# Patient Record
Sex: Male | Born: 1960 | Race: White | Hispanic: No | Marital: Married | Smoking: Former smoker
Health system: Southern US, Community
[De-identification: ages and names within clinical notes are randomized; demographics above are authoritative.]

## PROBLEM LIST (undated history)

## (undated) DIAGNOSIS — E785 Hyperlipidemia, unspecified: Secondary | ICD-10-CM

## (undated) DIAGNOSIS — R030 Elevated blood-pressure reading, without diagnosis of hypertension: Secondary | ICD-10-CM

## (undated) HISTORY — DX: Hyperlipidemia, unspecified: E78.5

## (undated) HISTORY — DX: Elevated blood-pressure reading, without diagnosis of hypertension: R03.0

## (undated) HISTORY — PX: HERNIA REPAIR: SHX51

---
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
## 1994-09-02 HISTORY — PX: NASAL SEPTUM SURGERY: SHX37

## 1995-09-03 HISTORY — PX: SPINAL FUSION: SHX223

## 1998-09-02 HISTORY — PX: OTHER SURGICAL HISTORY: SHX169

## 2010-08-14 ENCOUNTER — Encounter (INDEPENDENT_AMBULATORY_CARE_PROVIDER_SITE_OTHER): Payer: Self-pay | Admitting: *Deleted

## 2010-08-28 ENCOUNTER — Ambulatory Visit: Payer: Self-pay | Admitting: Internal Medicine

## 2010-08-28 DIAGNOSIS — K219 Gastro-esophageal reflux disease without esophagitis: Secondary | ICD-10-CM

## 2010-08-28 DIAGNOSIS — K625 Hemorrhage of anus and rectum: Secondary | ICD-10-CM

## 2010-09-05 ENCOUNTER — Telehealth: Payer: Self-pay | Admitting: Internal Medicine

## 2010-10-04 NOTE — Assessment & Plan Note (Signed)
Summary: ACID REFLUX ISSUES (new patient)   History of Present Illness Visit Type: Initial Visit Primary GI MD: Yancey Flemings MD Primary Provider: n/a Requesting Provider: n/a Chief Complaint: persistant GERD x 1 year History of Present Illness:   50 year old white male with a history of hyperlipidemia and cervical spine disease. He is self-referred regarding problems with chronic GERD. 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 He reports to me a one-year history of problems with pyrosis, regurgitation, and cough. He was seen by an ENT in [place] about 3-4 months ago and diagnosed with GERD. He was placed on omeprazole 40 mg daily for 2 months. This resolved symptoms. Off medication symptoms have returned. Prilosec OTC in the equivalent dosage does not seem to work quite as well. His weight has fluctuated around its current value. He denies nausea, vomiting, or dysphagia. He will occasionally cough with meals.. Occasional dark stools.. No prior history of GI problems or GI evaluations. His review of systems is remarkable for minor intermittent rectal bleeding as manifested by bright red blood on the tissue over the past 6 months. No family history of colon cancer.   GI Review of Systems    Reports acid reflux, belching, bloating, and  heartburn.      Denies abdominal pain, chest pain, dysphagia with liquids, dysphagia with solids, loss of appetite, nausea, vomiting, vomiting blood, weight loss, and  weight gain.      Reports black tarry stools and  rectal bleeding.     Denies anal fissure, change in bowel habit, constipation, diarrhea, diverticulosis, fecal incontinence, heme positive stool, hemorrhoids, irritable bowel syndrome, jaundice, light color stool, liver problems, and  rectal pain. Preventive Screening-Counseling & Management  Alcohol-Tobacco     Smoking Status: quit      Drug Use:  no.       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  Alcohol Use - no Daily Caffeine Use Illicit Drug Use - no Smoking Status:  quit Drug Use:  no  Review of Systems       The patient complains of allergy/sinus, anxiety-new, cough, depression-new, and fatigue.  The patient denies anemia, arthritis/joint pain, back pain, blood in urine, breast changes/lumps, change in vision, confusion, coughing up blood, fainting, fever, headaches-new, hearing problems, heart murmur, heart rhythm changes, itching, menstrual pain, muscle pains/cramps, night sweats, nosebleeds, pregnancy symptoms, shortness of breath, skin rash, sleeping problems, sore throat, swelling of feet/legs, swollen lymph glands, thirst - excessive , urination - excessive , urination changes/pain, urine leakage, vision changes, and voice change.    Vital Signs:  Patient profile:   50 year old male Height:      75 inches Weight:      236.13 pounds BMI:     29.62 Pulse rate:   80 / minute Pulse rhythm:   regular BP sitting:   122 / 90  (left arm) Cuff size:   regular  Vitals Entered By: June McMurray CMA Duncan Dull) (August 28, 2010 9:16 AM)  Physical Exam  General:  Well developed, well nourished, no acute distress. Head:  Normocephalic and atraumatic. Eyes:  PERRLA, no icterus. Ears:  Normal auditory acuity. Mouth:  No deformity or lesions, dentition normal. Neck:  Supple; no masses or thyromegaly. Lungs:  Clear throughout to auscultation. Heart:  Regular rate and rhythm; no murmurs, rubs,  or  bruits. Abdomen:  Soft, nontender and nondistended. No masses, hepatosplenomegaly or hernias noted. Normal bowel sounds. Msk:  Symmetrical with no gross deformities. Normal posture. Pulses:  Normal pulses noted. Extremities:  No clubbing, cyanosis, edema or deformities noted. Neurologic:  Alert and  oriented x4 Skin:  Intact without significant lesions or rashes. Psych:  Alert and cooperative. Normal mood and affect.   Impression & Recommendations:  Problem # 1:  GERD (ICD-530.81) chronic GERD requiring higher dose PPI for control of symptoms.  Plan: #1. Discussion on reflux disease pathophysiology and treatment as well as complications #2. Literature on GERD provided #3. Reflux precautions. Literature on reflux precautions also provided #4. Upper endoscopy to screen for Barrett's esophagus #5. Prescribe omeprazole 40 mg daily #6. Routine followup in one year. Advised  Problem # 2:  SCREENING COLORECTAL-CANCER (ICD-V76.51) baseline risk. Appropriate candidate without contraindication. The nature of colonoscopy as well as the risks, benefits, and alternatives were reviewed. He understood and agreed to proceed. Movi prep prescribed. The patient instructed on its use.  Problem # 3:  RECTAL BLEEDING (ICD-569.3) minor intermittent. Likely due to benign anorectal pathology. Can be assessed at time of screening colonoscopy  Patient Instructions: 1)  Pick up your prescription from your pharmacy.  2)  Call back to schedule a Upper Endoscopy and Colonoscopy after you look at your schedule.  3)  Avoid foods high in acid content ( tomatoes, citrus juices, spicy foods) . Avoid eating within 3 to 4 hours of lying down or before exercising. Do not over eat; try smaller more frequent meals. Elevate head of bed four inches when sleeping.  4)  The medication list was reviewed and reconciled.  All changed / newly prescribed medications were explained.  A complete medication list was provided to the  patient / caregiver. 5)  Secure a primary care provider Prescriptions: OMEPRAZOLE 40 MG CPDR (OMEPRAZOLE) one tablet by mouth every morning  #30 x 11   Entered by:   Christie Nottingham CMA (AAMA)   Authorized by:   Hilarie Fredrickson MD   Signed by:   Christie Nottingham CMA (AAMA) on 08/28/2010   Method used:   Electronically to        [facility] [place] [address] [phone]* (retail)       [address] [place], [place]  [address]       Ph: [phone]       Fax: [phone]   RxID:   [id number]

## 2010-10-04 NOTE — Medication Information (Signed)
Summary: Prior Authorization Omeprazole / [facility]  Prior Authorization Omeprazole / [facility]   Imported By: Lennie Odor 09/05/2010 15:06:22  _____________________________________________________________________  External Attachment:    Type:   Image     Comment:   External Document

## 2010-10-04 NOTE — Progress Notes (Signed)
Summary: Medication  Phone Note Call from Patient Call back at Home Phone [phone]   Caller: Patient Call For: Dr.Mima Cranmore Reason for Call: Refill Medication Summary of Call: Pt [facility] [place] because [facility] no longer accepts his insurance Initial call taken by: [place] Johnson,  September 05, 2010 4:19 PM    Prescriptions: OMEPRAZOLE 40 MG CPDR (OMEPRAZOLE) one tablet by mouth every morning  #30 x 11   Entered by:   Milford Cage NCMA   Authorized by:   Hilarie Fredrickson MD   Signed by:   Milford Cage NCMA on 09/05/2010   Method used:   [facility]  [place] [address] (retail[facility]       [address]       [place], [place]  [address]       Ph: [phone] or [phone]       Fax: [phone]   RxID:   [id number]

## 2010-10-04 NOTE — Letter (Signed)
Summary: New Patient letter  [facility]  [address] [place], [place] [address]   Phone: [phone]  Fax: [phone]       08/14/2010 MRN: [id number]    [place] [address] [place], [place]  [address]  Dear Mr. Klas,  Welcome to the Gastroenterology Division at [facility].    You are scheduled to see Dr. Marina Goodell on Tuesday, 08/28/10 at 9:15 a.m. on the 3rd floor at [facility], [address]  We ask that you try to arrive at our office 15 minutes prior to your appointment time to allow for check-in.  We would like you to complete the enclosed self-administered evaluation form prior to your visit and bring it with you on the day of your appointment.  We will review it with you.  Also, please bring a complete list of all your medications or, if you prefer, bring the medication bottles and we will list them.  Please bring your insurance card so that we may make a copy of it.  If your insurance requires a referral to see a specialist, please bring your referral form from your primary care physician.  Co-payments are due at the time of your visit and may be paid by cash, check or credit card.     Your office visit will consist of a consult with your physician (includes a physical exam), any laboratory testing he/she may order, scheduling of any necessary diagnostic testing (e.g. x-ray, ultrasound, CT-scan), and scheduling of a procedure (e.g. Endoscopy, Colonoscopy) if required.  Please allow enough time on your schedule to allow for any/all of these possibilities.    If you cannot keep your appointment, please call [phone] to cancel or reschedule prior to your appointment date.  This allows [place] the opportunity to schedule an appointment for another patient in need of care.  If you do not cancel or reschedule by 5 p.m. the business day prior to your appointment date, you will be charged a $50.00 late cancellation/no-show fee.     Thank you for choosing [facility] for your medical needs.  We appreciate the opportunity to care for you.  Please visit [place] at our website  to learn more about our practice.                     Sincerely,                                                             The Gastroenterology Division

## 2010-10-04 NOTE — Medication Information (Signed)
Summary: Approved/BC[employer]  Approved/[employer]BS   Imported By: Lester Stockton 09/06/2010 10:07:35  _____________________________________________________________________  External Attachment:    Type:   Image     Comment:   External Document

## 2011-10-07 ENCOUNTER — Other Ambulatory Visit: Payer: Self-pay | Admitting: Internal Medicine

## 2012-12-17 ENCOUNTER — Ambulatory Visit (INDEPENDENT_AMBULATORY_CARE_PROVIDER_SITE_OTHER): Payer: BC Managed Care – PPO | Admitting: Family Medicine

## 2012-12-17 ENCOUNTER — Encounter: Payer: Self-pay | Admitting: Family Medicine

## 2012-12-17 VITALS — BP 120/90 | HR 73 | Temp 97.6°F | Ht 74.0 in | Wt 244.5 lb

## 2012-12-17 DIAGNOSIS — J309 Allergic rhinitis, unspecified: Secondary | ICD-10-CM

## 2012-12-17 DIAGNOSIS — Z0000 Encounter for general adult medical examination without abnormal findings: Secondary | ICD-10-CM

## 2012-12-17 LAB — CBC WITH DIFFERENTIAL/PLATELET
Basophils Relative: 0.6 % (ref 0.0–3.0)
Eosinophils Relative: 1.6 % (ref 0.0–5.0)
Lymphocytes Relative: 35.8 % (ref 12.0–46.0)
MCV: 84.2 fl (ref 78.0–100.0)
Neutrophils Relative %: 46.5 % (ref 43.0–77.0)
RBC: 5.63 Mil/uL (ref 4.22–5.81)
WBC: 5.3 10*3/uL (ref 4.5–10.5)

## 2012-12-17 LAB — PSA: PSA: 0.77 ng/mL (ref 0.10–4.00)

## 2012-12-17 LAB — LIPID PANEL
Cholesterol: 176 mg/dL (ref 0–200)
Total CHOL/HDL Ratio: 6
Triglycerides: 213 mg/dL — ABNORMAL HIGH (ref 0.0–149.0)
VLDL: 42.6 mg/dL — ABNORMAL HIGH (ref 0.0–40.0)

## 2012-12-17 LAB — COMPREHENSIVE METABOLIC PANEL
ALT: 28 U/L (ref 0–53)
AST: 22 U/L (ref 0–37)
Creatinine, Ser: 1.1 mg/dL (ref 0.4–1.5)
Total Bilirubin: 0.4 mg/dL (ref 0.3–1.2)

## 2012-12-17 LAB — LDL CHOLESTEROL, DIRECT: Direct LDL: 105.6 mg/dL

## 2012-12-17 NOTE — Progress Notes (Signed)
Office Note 12/17/2012  CC:  Chief Complaint  Patient presents with  . Establish Care    HPI:  Jimmy Cox is a 52 y.o. White male who is here to establish care, discuss respiratory symptoms. Patient's most recent primary MD: none Old records were not reviewed prior to or during today's visit.  Pt presents complaining of respiratory symptoms for 7  days.  Primary symptoms are: nasal congestion/PND, burning in throat and chest and some cough.  Chilled last night--describes rigors +"feverish".  Worst symptoms seems to be the burning throat and cough.  Lately the symptoms seem to be worsening.   No wheezing but wants to cough easily with deep breath.  Mild malaise but no myalgias or arthralgias.  Additional ROS: no n/v/d or abdominal pain.  No rash.  No neck stiffness.   +Mild fatigue. Appetite good. No meds have been tried.   Past Medical History  Diagnosis Date  . Elevated blood pressure reading without diagnosis of hypertension     TLC only helped  . Hyperlipidemia     TLC only helped    Past Surgical History  Procedure Laterality Date  . Tonsillectomy  1966  . Nasal septum surgery  1996  . Spinal fusion  1997    c5-c6  . Nerve bundle surgery  2000    c5-c6  . Hernia repair Bilateral     umbilical    Family History  Problem Relation Age of Onset  . Arthritis Mother   . Hyperlipidemia Mother   . Heart disease Mother   . Stroke Mother   . Hypertension Mother   . Diabetes Mother   . Hyperlipidemia Father   . Heart disease Father   . Hyperlipidemia Maternal Grandmother   . Hyperlipidemia Maternal Grandfather   . Hyperlipidemia Paternal Grandmother   . Hyperlipidemia Paternal Grandfather   . Cancer Cousin     Lung    History   Social History  . Marital Status: Married    Spouse Name: N/A    Number of Children: N/A  . Years of Education: N/A   Occupational History  . Not on file.   Social History Main Topics  . Smoking status: Former Games developer   . Smokeless tobacco: Former Neurosurgeon  . Alcohol Use: Yes  . Drug Use: No  . Sexually Active: Not on file   Other Topics Concern  . Not on file   Social History Narrative   Married, 1 son.   Orig from [place], relocated to [facility] 2012.   Occupation: Community education officer.   No tob, drinks glass of wine occ, no drugs.    Outpatient Encounter Prescriptions as of 12/17/2012  Medication Sig Dispense Refill  . HOMEOPATHIC PRODUCTS PO Take by mouth. Warts      . [DISCONTINUED] omeprazole (PRILOSEC) 40 MG capsule TAKE ONE (1) CAPSULE EACH DAY  30 capsule  1   No facility-administered encounter medications on file as of 12/17/2012.    No Known Allergies  ROS Review of Systems  Constitutional: Negative for fever and fatigue.  HENT:       See hpi  Eyes: Negative for visual disturbance.  Respiratory:       See hpi  Cardiovascular: Negative for chest pain.  Gastrointestinal: Negative for nausea and abdominal pain.  Genitourinary: Negative for dysuria.  Musculoskeletal: Negative for back pain and joint swelling.  Skin: Negative for rash.  Neurological: Negative for weakness and headaches.  Hematological: Negative for adenopathy.    PE;  Blood pressure 120/90, pulse 73, temperature 97.6 F (36.4 C), temperature source Oral, height 6\' 2"  (1.88 m), weight 244 lb 8 oz (110.904 kg), SpO2 97.00%. VS: noted--normal. Gen: alert, NAD, NONTOXIC APPEARING. HEENT: eyes without injection, drainage, or swelling.  Ears: EACs clear, TMs with normal light reflex and landmarks.  Nose: Clear rhinorrhea, with some dried, crusty exudate adherent to mildly injected mucosa.  No purulent d/c.  No paranasal sinus TTP.  No facial swelling.  Throat and mouth without focal lesion.  No pharyngial swelling, erythema, or exudate.   Neck: supple, no LAD.   LUNGS: CTA bilat, nonlabored resps.   CV: RRR, no m/r/g. EXT: no c/c/e SKIN: no rash   Pertinent labs:  None today  ASSESSMENT AND PLAN:    New pt: no old records to obtain per pt.  Allergic rhinitis Buy OTC generic zyrtec OR claritin OR allegra and take one daily.   Buy OTC generic nasal saline spray and use 2-3 sprays in each nostril several times per day. Buy Nasacort OTC and use 2 sprays each nostril once daily. Signs/symptoms to call or return for were reviewed and pt expressed understanding.    An After Visit Summary was printed and given to the patient.  Return for make appt for complete physical at your earliest convenience; get fasting labs the week prior.Marland Kitchen

## 2012-12-17 NOTE — Patient Instructions (Signed)
Buy OTC generic zyrtec OR claritin OR allegra and take one daily.   Buy OTC generic nasal saline spray and use 2-3 sprays in each nostril several times per day. Buy Nasacort OTC and use 2 sprays each nostril once daily.

## 2012-12-17 NOTE — Addendum Note (Signed)
Addended by: Baldemar Lenis R on: 12/17/2012 09:17 AM   Modules accepted: Orders

## 2012-12-17 NOTE — Assessment & Plan Note (Signed)
Buy OTC generic zyrtec OR claritin OR allegra and take one daily.   Buy OTC generic nasal saline spray and use 2-3 sprays in each nostril several times per day. Buy Nasacort OTC and use 2 sprays each nostril once daily. Signs/symptoms to call or return for were reviewed and pt expressed understanding.

## 2016-02-28 ENCOUNTER — Emergency Department (HOSPITAL_BASED_OUTPATIENT_CLINIC_OR_DEPARTMENT_OTHER): Payer: Commercial Managed Care - HMO

## 2016-02-28 ENCOUNTER — Encounter (HOSPITAL_BASED_OUTPATIENT_CLINIC_OR_DEPARTMENT_OTHER): Payer: Self-pay | Admitting: *Deleted

## 2016-02-28 ENCOUNTER — Emergency Department (HOSPITAL_BASED_OUTPATIENT_CLINIC_OR_DEPARTMENT_OTHER)
Admission: EM | Admit: 2016-02-28 | Discharge: 2016-02-28 | Disposition: A | Discharge status: Home / Self Care | Payer: Commercial Managed Care - HMO | Attending: Emergency Medicine | Admitting: Emergency Medicine

## 2016-02-28 DIAGNOSIS — K7689 Other specified diseases of liver: Secondary | ICD-10-CM | POA: Insufficient documentation

## 2016-02-28 DIAGNOSIS — Z87891 Personal history of nicotine dependence: Secondary | ICD-10-CM | POA: Diagnosis not present

## 2016-02-28 DIAGNOSIS — E785 Hyperlipidemia, unspecified: Secondary | ICD-10-CM | POA: Insufficient documentation

## 2016-02-28 DIAGNOSIS — R0789 Other chest pain: Secondary | ICD-10-CM | POA: Insufficient documentation

## 2016-02-28 DIAGNOSIS — M546 Pain in thoracic spine: Secondary | ICD-10-CM | POA: Insufficient documentation

## 2016-02-28 DIAGNOSIS — R079 Chest pain, unspecified: Secondary | ICD-10-CM

## 2016-02-28 LAB — CBC WITH DIFFERENTIAL/PLATELET
Basophils Absolute: 0 10*3/uL (ref 0.0–0.1)
Basophils Relative: 0 %
EOS PCT: 4 %
Eosinophils Absolute: 0.3 10*3/uL (ref 0.0–0.7)
HEMATOCRIT: 44.6 % (ref 39.0–52.0)
Hemoglobin: 15.3 g/dL (ref 13.0–17.0)
LYMPHS ABS: 2.9 10*3/uL (ref 0.7–4.0)
Lymphocytes Relative: 36 %
MCH: 28 pg (ref 26.0–34.0)
MCHC: 34.3 g/dL (ref 30.0–36.0)
MCV: 81.5 fL (ref 78.0–100.0)
MONO ABS: 0.4 10*3/uL (ref 0.1–1.0)
Monocytes Relative: 5 %
NEUTROS ABS: 4.4 10*3/uL (ref 1.7–7.7)
Neutrophils Relative %: 55 %
Platelets: 227 10*3/uL (ref 150–400)
RBC: 5.47 MIL/uL (ref 4.22–5.81)
RDW: 13.8 % (ref 11.5–15.5)
WBC: 8 10*3/uL (ref 4.0–10.5)

## 2016-02-28 LAB — COMPREHENSIVE METABOLIC PANEL
ALBUMIN: 3.8 g/dL (ref 3.5–5.0)
ALK PHOS: 63 U/L (ref 38–126)
ALT: 23 U/L (ref 17–63)
ANION GAP: 6 (ref 5–15)
AST: 19 U/L (ref 15–41)
BILIRUBIN TOTAL: 0.9 mg/dL (ref 0.3–1.2)
BUN: 13 mg/dL (ref 6–20)
CALCIUM: 9.2 mg/dL (ref 8.9–10.3)
CO2: 24 mmol/L (ref 22–32)
Chloride: 107 mmol/L (ref 101–111)
Creatinine, Ser: 0.95 mg/dL (ref 0.61–1.24)
GLUCOSE: 93 mg/dL (ref 65–99)
POTASSIUM: 3.7 mmol/L (ref 3.5–5.1)
Sodium: 137 mmol/L (ref 135–145)
TOTAL PROTEIN: 6.6 g/dL (ref 6.5–8.1)

## 2016-02-28 LAB — TROPONIN I: Troponin I: <0.03 ng/mL (ref <0.03)

## 2016-02-28 MED ORDER — MORPHINE SULFATE (PF) 4 MG/ML IV SOLN
4.0000 mg | Freq: Once | INTRAVENOUS | Status: AC
  Administered 2016-02-28: 4 mg via INTRAVENOUS
  Filled 2016-02-28: qty 1

## 2016-02-28 MED ORDER — IOPAMIDOL (ISOVUE-370) INJECTION 76%
100.0000 mL | Freq: Once | INTRAVENOUS | Status: AC | PRN
  Administered 2016-02-28: 100 mL via INTRAVENOUS

## 2016-02-28 NOTE — Discharge Instructions (Signed)
Nonspecific Chest Pain  °Chest pain can be caused by many different conditions. There is always a chance that your pain could be related to something serious, such as a heart attack or a blood clot in your lungs. Chest pain can also be caused by conditions that are not life-threatening. If you have chest pain, it is very important to follow up with your health care provider. °CAUSES  °Chest pain can be caused by: °· Heartburn. °· Pneumonia or bronchitis. °· Anxiety or stress. °· Inflammation around your heart (pericarditis) or lung (pleuritis or pleurisy). °· A blood clot in your lung. °· A collapsed lung (pneumothorax). It can develop suddenly on its own (spontaneous pneumothorax) or from trauma to the chest. °· Shingles infection (varicella-zoster virus). °· Heart attack. °· Damage to the bones, muscles, and cartilage that make up your chest wall. This can include: °¨ Bruised bones due to injury. °¨ Strained muscles or cartilage due to frequent or repeated coughing or overwork. °¨ Fracture to one or more ribs. °¨ Sore cartilage due to inflammation (costochondritis). °RISK FACTORS  °Risk factors for chest pain may include: °· Activities that increase your risk for trauma or injury to your chest. °· Respiratory infections or conditions that cause frequent coughing. °· Medical conditions or overeating that can cause heartburn. °· Heart disease or family history of heart disease. °· Conditions or health behaviors that increase your risk of developing a blood clot. °· Having had chicken pox (varicella zoster). °SIGNS AND SYMPTOMS °Chest pain can feel like: °· Burning or tingling on the surface of your chest or deep in your chest. °· Crushing, pressure, aching, or squeezing pain. °· Dull or sharp pain that is worse when you move, cough, or take a deep breath. °· Pain that is also felt in your back, neck, shoulder, or arm, or pain that spreads to any of these areas. °Your chest pain may come and go, or it may stay  constant. °DIAGNOSIS °Lab tests or other studies may be needed to find the cause of your pain. Your health care provider may have you take a test called an ambulatory ECG (electrocardiogram). An ECG records your heartbeat patterns at the time the test is performed. You may also have other tests, such as: °· Transthoracic echocardiogram (TTE). During echocardiography, sound waves are used to create a picture of all of the heart structures and to look at how blood flows through your heart. °· Transesophageal echocardiogram (TEE). This is a more advanced imaging test that obtains images from inside your body. It allows your health care provider to see your heart in finer detail. °· Cardiac monitoring. This allows your health care provider to monitor your heart rate and rhythm in real time. °· Holter monitor. This is a portable device that records your heartbeat and can help to diagnose abnormal heartbeats. It allows your health care provider to track your heart activity for several days, if needed. °· Stress tests. These can be done through exercise or by taking medicine that makes your heart beat more quickly. °· Blood tests. °· Imaging tests. °TREATMENT  °Your treatment depends on what is causing your chest pain. Treatment may include: °· Medicines. These may include: °¨ Acid blockers for heartburn. °¨ Anti-inflammatory medicine. °¨ Pain medicine for inflammatory conditions. °¨ Antibiotic medicine, if an infection is present. °¨ Medicines to dissolve blood clots. °¨ Medicines to treat coronary artery disease. °· Supportive care for conditions that do not require medicines. This may include: °¨ Resting. °¨ Applying heat   or cold packs to injured areas. °¨ Limiting activities until pain decreases. °HOME CARE INSTRUCTIONS °· If you were prescribed an antibiotic medicine, finish it all even if you start to feel better. °· Avoid any activities that bring on chest pain. °· Do not use any tobacco products, including  cigarettes, chewing tobacco, or electronic cigarettes. If you need help quitting, ask your health care provider. °· Do not drink alcohol. °· Take medicines only as directed by your health care provider. °· Keep all follow-up visits as directed by your health care provider. This is important. This includes any further testing if your chest pain does not go away. °· If heartburn is the cause for your chest pain, you may be told to keep your head raised (elevated) while sleeping. This reduces the chance that acid will go from your stomach into your esophagus. °· Make lifestyle changes as directed by your health care provider. These may include: °¨ Getting regular exercise. Ask your health care provider to suggest some activities that are safe for you. °¨ Eating a heart-healthy diet. A registered dietitian can help you to learn healthy eating options. °¨ Maintaining a healthy weight. °¨ Managing diabetes, if necessary. °¨ Reducing stress. °SEEK MEDICAL CARE IF: °· Your chest pain does not go away after treatment. °· You have a rash with blisters on your chest. °· You have a fever. °SEEK IMMEDIATE MEDICAL CARE IF:  °· Your chest pain is worse. °· You have an increasing cough, or you cough up blood. °· You have severe abdominal pain. °· You have severe weakness. °· You faint. °· You have chills. °· You have sudden, unexplained chest discomfort. °· You have sudden, unexplained discomfort in your arms, back, neck, or jaw. °· You have shortness of breath at any time. °· You suddenly start to sweat, or your skin gets clammy. °· You feel nauseous or you vomit. °· You suddenly feel light-headed or dizzy. °· Your heart begins to beat quickly, or it feels like it is skipping beats. °These symptoms may represent a serious problem that is an emergency. Do not wait to see if the symptoms will go away. Get medical help right away. Call your local emergency services (911 in the [place] Do not drive yourself to the hospital. °  °This  information is not intended to replace advice given to you by your health care provider. Make sure you discuss any questions you have with your health care provider. °  °Document Released: 05/29/2005 Document Revised: 09/09/2014 Document Reviewed: 03/25/2014 °Elsevier Interactive Patient Education ©2016 [employer] ° °

## 2016-02-28 NOTE — ED Notes (Signed)
MD at bedside to discuss results, etc. Answering questions.

## 2016-02-28 NOTE — ED Provider Notes (Signed)
CSN: [id number]     Arrival date & time 02/28/16  1305 History   First MD Initiated Contact with Patient 02/28/16 1315     Chief Complaint  Patient presents with  . Chest Pain     (Consider location/radiation/quality/duration/timing/severity/associated sxs/prior Treatment) HPI Comments: 55 year old male with history of hyperlipidemia presents for chest pain. The patient reports he was pouring water into a coffee this morning and had the sudden onset of sharp pain in his back and his chest. He said it seemed to somewhat subside but while at work it seemed to come back and has progressively worsened since that time. He denies shortness of breath or diaphoresis. He said that the chest is now tight at this time but that most of his discomfort is across his shoulder blades. Denies fevers or chills. Reports one episode like this previously that resolved on its own within an hour. No recent long car trips, immobility, leg swelling. He believes that he had multiple family members who had heart attacks in their 5850s and 8660s.   Past Medical History  Diagnosis Date  . Elevated blood pressure reading without diagnosis of hypertension     TLC only helped  . Hyperlipidemia     TLC only helped   Past Surgical History  Procedure Laterality Date  . Tonsillectomy  1966  . Nasal septum surgery  1996  . Spinal fusion  1997    c5-c6  . Nerve bundle surgery  2000    c5-c6  . Hernia repair Bilateral     umbilical   Family History  Problem Relation Age of Onset  . Arthritis Mother   . Hyperlipidemia Mother   . Heart disease Mother   . Stroke Mother   . Hypertension Mother   . Diabetes Mother   . Hyperlipidemia Father   . Heart disease Father   . Hyperlipidemia Maternal Grandmother   . Hyperlipidemia Maternal Grandfather   . Hyperlipidemia Paternal Grandmother   . Hyperlipidemia Paternal Grandfather   . Cancer Cousin     Lung   Social History  Substance Use Topics  . Smoking status: Former  Games developermoker  . Smokeless tobacco: Former NeurosurgeonUser  . Alcohol Use: Yes    Review of Systems  Constitutional: Negative for fever, chills, diaphoresis and fatigue.  HENT: Negative for congestion, postnasal drip, rhinorrhea and sinus pressure.   Eyes: Negative for visual disturbance.  Respiratory: Positive for chest tightness. Negative for cough and shortness of breath.   Cardiovascular: Positive for chest pain. Negative for palpitations.  Gastrointestinal: Negative for nausea, vomiting, abdominal pain, diarrhea and constipation.  Genitourinary: Negative for dysuria, urgency and hematuria.  Musculoskeletal: Positive for back pain (between shoulder blades). Negative for myalgias.  Skin: Negative for rash.  Neurological: Negative for dizziness, seizures, weakness and headaches.  Hematological: Does not bruise/bleed easily.      Allergies  Review of patient's allergies indicates no known allergies.  Home Medications   Prior to Admission medications   Not on File   BP 127/98 mmHg  Pulse 63  Temp(Src) 98.7 F (37.1 C) (Oral)  Resp 16  Ht 6\' 3"  (1.905 m)  Wt 240 lb (108.863 kg)  BMI 30.00 kg/m2  SpO2 98% Physical Exam  Constitutional: He is oriented to person, place, and time. He appears well-developed and well-nourished. No distress.  HENT:  Head: Normocephalic and atraumatic.  Right Ear: External ear normal.  Left Ear: External ear normal.  Mouth/Throat: Oropharynx is clear and moist. No oropharyngeal exudate.  Eyes:  EOM are normal. Pupils are equal, round, and reactive to light.  Neck: Normal range of motion. Neck supple.  Cardiovascular: Normal rate, regular rhythm, normal heart sounds and intact distal pulses.   No murmur heard. Pulmonary/Chest: Effort normal. No respiratory distress. He has no wheezes. He has no rales.  Abdominal: Soft. He exhibits no distension. There is no tenderness.  Musculoskeletal: He exhibits no edema.  Neurological: He is alert and oriented to person,  place, and time.  Skin: Skin is warm and dry. No rash noted. He is not diaphoretic.  Vitals reviewed.   ED Course  Procedures (including critical care time) Labs Review Labs Reviewed  TROPONIN I  CBC WITH DIFFERENTIAL/PLATELET  COMPREHENSIVE METABOLIC PANEL    Imaging Review Dg Chest 2 View  02/28/2016  CLINICAL DATA:  55 year old with chest tightness and interscapular pain today. Elevated blood pressure. EXAM: CHEST  2 VIEW COMPARISON:  None. FINDINGS: The heart size and mediastinal contours are normal. There is minimal atelectasis at both lung bases. No edema, confluent airspace opacity or pleural effusion. The bones appear unremarkable. IMPRESSION: No acute cardiopulmonary process.  Minimal bibasilar atelectasis. Electronically Signed   By: Carey BullocksWilliam  Veazey M.D.   On: 02/28/2016 13:31   I have personally reviewed and evaluated these images and lab results as part of my medical decision-making.   EKG Interpretation   Date/Time:  Wednesday February 28 2016 13:15:29 EDT Ventricular Rate:  62 PR Interval:  166 QRS Duration: 96 QT Interval:  416 QTC Calculation: 422 R Axis:   -22 Text Interpretation:  Normal sinus rhythm Cannot rule out Anterior infarct  , age undetermined Abnormal ECG No previous ECGs available Confirmed by  NGUYEN, EMILY ([id number]) on 02/28/2016 1:18:20 PM      MDM  Patient seen and evaluated in stable condition. Benign examination. Somewhat atypical chest pain and has no component of exertion. Patient with risk factors for ACS including hyperlipidemia and family history. Initial troponin and blood work unremarkable. Chest x-ray unremarkable. EKG without any acute findings of ischemia. Concern with back pain for possible dissection and CTA ordered.  Case was signed out to the oncoming physician pending CTA and second troponin results. Final diagnoses:  None    1. Chest pain    Leta BaptistEmily Roe Nguyen, MD 02/28/16 [id number]

## 2016-02-28 NOTE — ED Notes (Signed)
Pt c/o sudden onset of upper back pain this am and the gradual onset of chest tightness today, denies sob

## 2016-02-28 NOTE — ED Notes (Signed)
Pt given d/c instructions as per chart. Verbalizes understanding. No questions. 

## 2016-11-15 IMAGING — CR DG CHEST 2V
2 series · 2 of 2 positions shown · non-contrast
Comparison: None.

CLINICAL DATA: 54-year-old with chest tightness and interscapular
pain today. Elevated blood pressure.

EXAM:
CHEST  2 VIEW

[w chest pa]
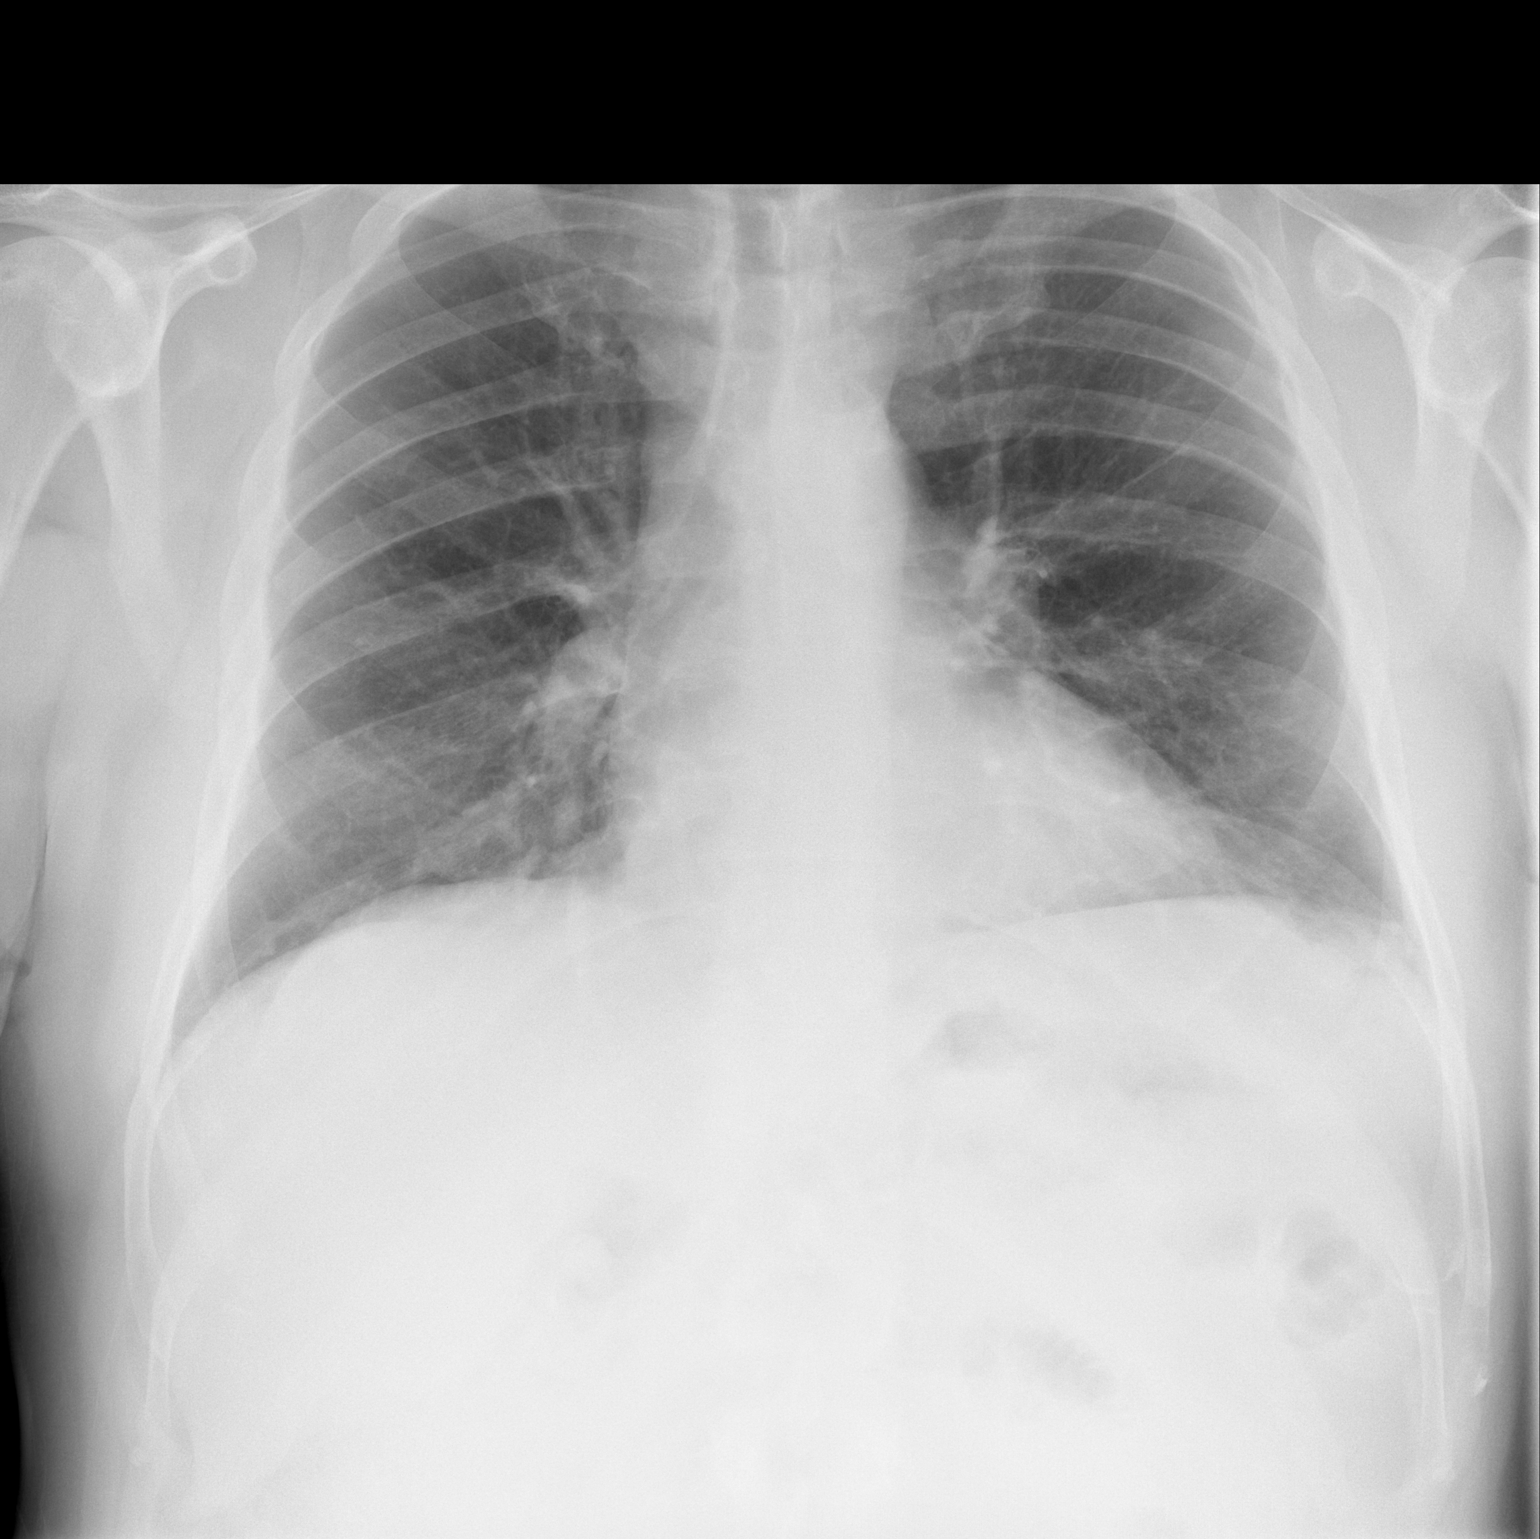

[w chest lat]
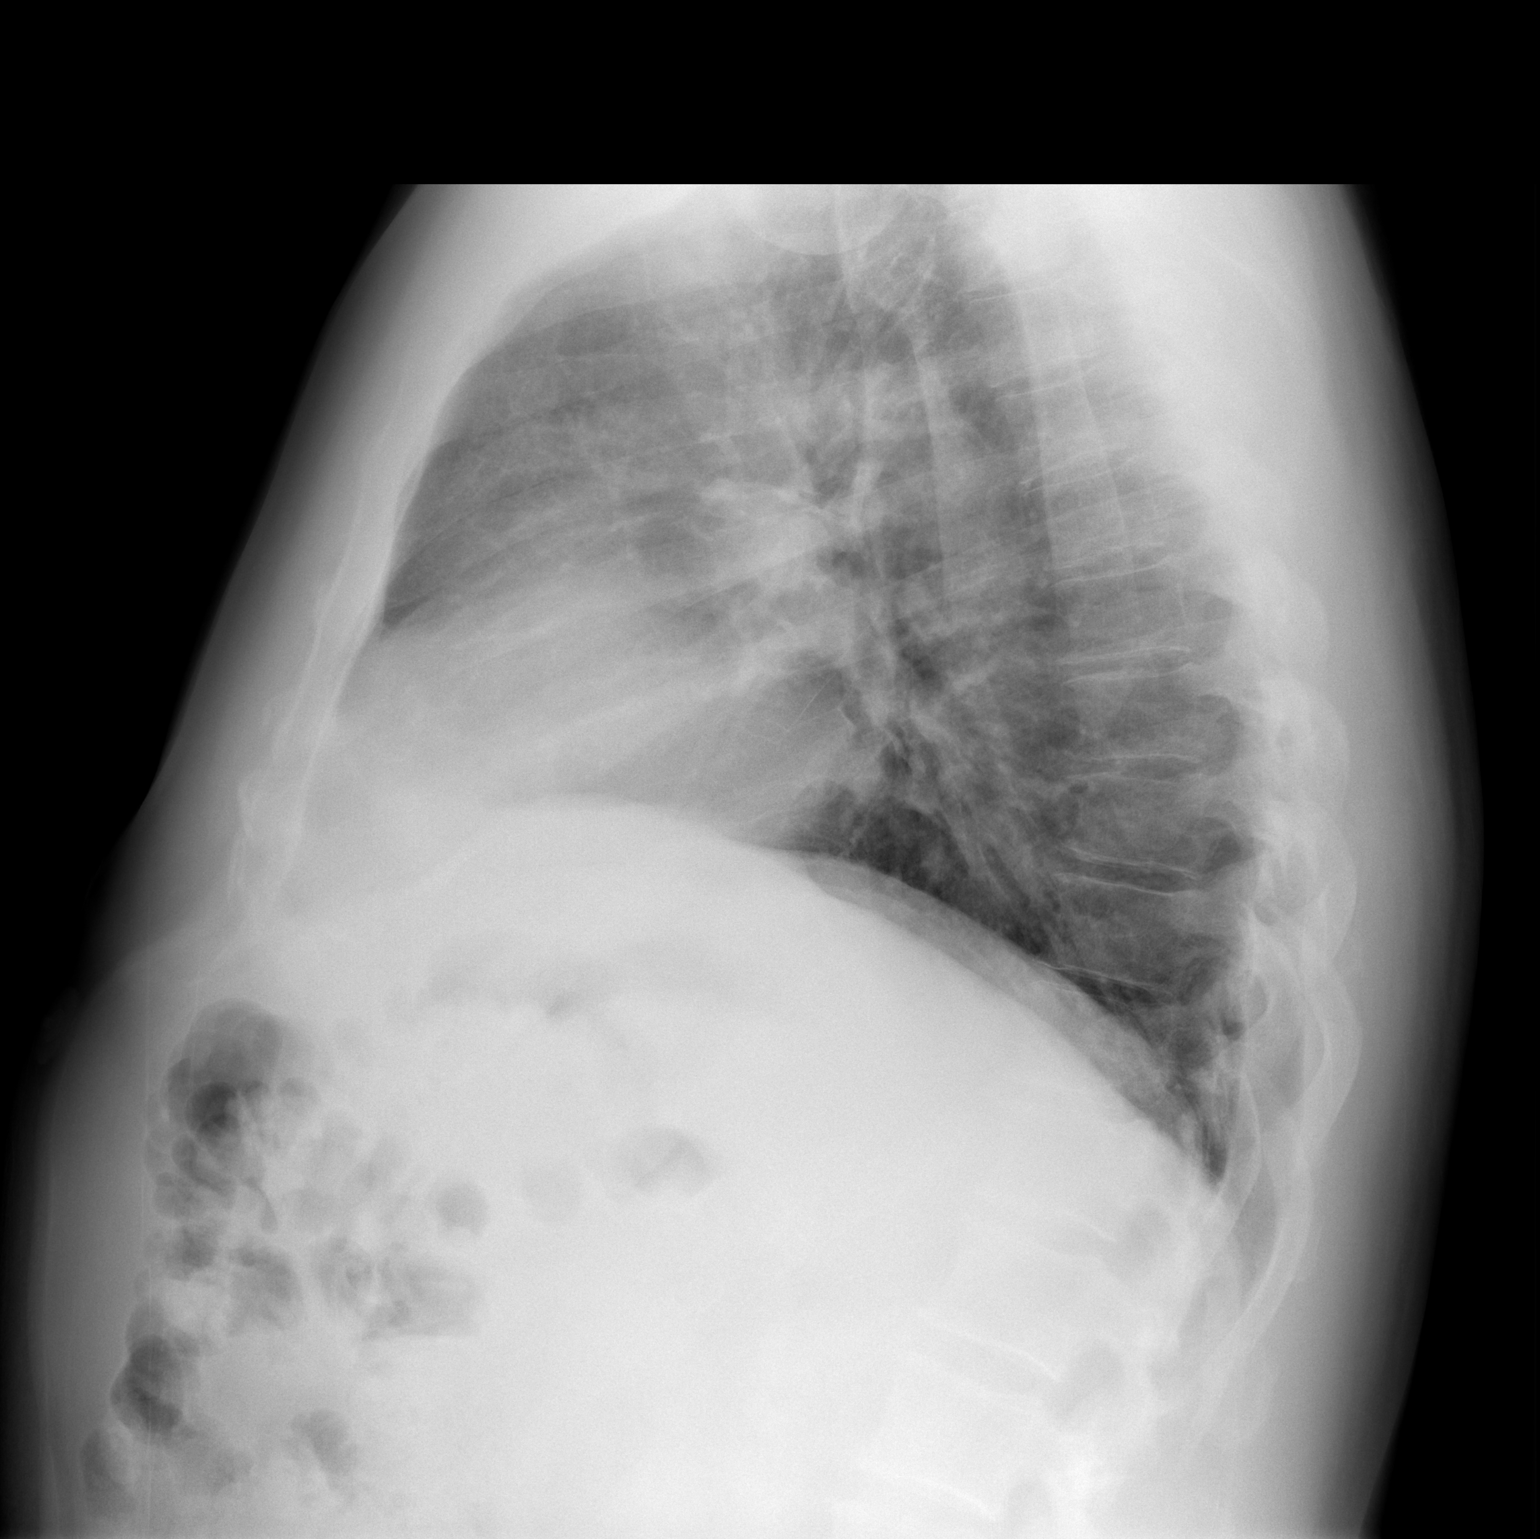

[2 of 2 positions shown; findings below may reference images not displayed]

FINDINGS: The heart size and mediastinal contours are normal. There is minimal
atelectasis at both lung bases. No edema, confluent airspace opacity
or pleural effusion. The bones appear unremarkable.
IMPRESSION: No acute cardiopulmonary process.  Minimal bibasilar atelectasis.
